# Patient Record
Sex: Male | Born: 1995 | Race: Black or African American | Hispanic: No | Marital: Single | Smoking: Current every day smoker
Health system: Southern US, Community
[De-identification: ages and names within clinical notes are randomized; demographics above are authoritative.]

---
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
## 2013-10-28 ENCOUNTER — Encounter (HOSPITAL_COMMUNITY): Payer: Self-pay | Admitting: Emergency Medicine

## 2013-10-28 ENCOUNTER — Emergency Department (HOSPITAL_COMMUNITY)
Admission: EM | Admit: 2013-10-28 | Discharge: 2013-10-28 | Disposition: A | Discharge status: Home / Self Care | Payer: Medicaid Other | Attending: Emergency Medicine | Admitting: Emergency Medicine

## 2013-10-28 DIAGNOSIS — J3489 Other specified disorders of nose and nasal sinuses: Secondary | ICD-10-CM | POA: Diagnosis not present

## 2013-10-28 DIAGNOSIS — K089 Disorder of teeth and supporting structures, unspecified: Secondary | ICD-10-CM | POA: Diagnosis present

## 2013-10-28 DIAGNOSIS — K0889 Other specified disorders of teeth and supporting structures: Secondary | ICD-10-CM

## 2013-10-28 DIAGNOSIS — K0389 Other specified diseases of hard tissues of teeth: Secondary | ICD-10-CM | POA: Diagnosis not present

## 2013-10-28 DIAGNOSIS — F17200 Nicotine dependence, unspecified, uncomplicated: Secondary | ICD-10-CM | POA: Insufficient documentation

## 2013-10-28 MED ORDER — HYDROCODONE-ACETAMINOPHEN 5-325 MG PO TABS: 1.0000 | ORAL_TABLET | Freq: Four times a day (QID) | ORAL | Status: AC | PRN

## 2013-10-28 MED ORDER — HYDROCODONE-ACETAMINOPHEN 5-325 MG PO TABS
2.0000 | ORAL_TABLET | Freq: Once | ORAL | Status: AC
  Administered 2013-10-28: 2 via ORAL
  Filled 2013-10-28: qty 2

## 2013-10-28 NOTE — Discharge Instructions (Signed)
Dental Pain °Toothache is pain in or around a tooth. It may get worse with chewing or with cold or heat.  °HOME CARE °· Your dentist may use a numbing medicine during treatment. If so, you may need to avoid eating until the medicine wears off. Ask your dentist about this. °· Only take medicine as told by your dentist or doctor. °· Avoid chewing food near the painful tooth until after all treatment is done. Ask your dentist about this. °GET HELP RIGHT AWAY IF:  °· The problem gets worse or new problems appear. °· You have a fever. °· There is redness and puffiness (swelling) of the face, jaw, or neck. °· You cannot open your mouth. °· There is pain in the jaw. °· There is very bad pain that is not helped by medicine. °MAKE SURE YOU:  °· Understand these instructions. °· Will watch your condition. °· Will get help right away if you are not doing well or get worse. °Document Released: 07/15/2007 Document Revised: 04/20/2011 Document Reviewed: 07/15/2007 °ExitCare® Patient Information ©2015 [employer]. This information is not intended to replace advice given to you by your health care provider. Make sure you discuss any questions you have with your health care provider. ° ° ° °Emergency Department Resource Guide °1) Find a Doctor and Pay Out of Pocket °Although you won't have to find out who is covered by your insurance plan, it is a good idea to ask around and get recommendations. You will then need to call the office and see if the doctor you have chosen will accept you as a new patient and what types of options they offer for patients who are self-pay. Some doctors offer discounts or will set up payment plans for their patients who do not have insurance, but you will need to ask so you aren't surprised when you get to your appointment. ° °2) Contact Your Local Health Department °Not all health departments have doctors that can see patients for sick visits, but many do, so it is worth a call to see if yours does.  If you don't know where your local health department is, you can check in your phone book. The CDC also has a tool to help you locate your state's health department, and many state websites also have listings of all of their local health departments. ° °3) Find a Walk-in Clinic °If your illness is not likely to be very severe or complicated, you may want to try a walk in clinic. These are popping up all over the country in pharmacies, drugstores, and shopping centers. They're usually staffed by nurse practitioners or physician assistants that have been trained to treat common illnesses and complaints. They're usually fairly quick and inexpensive. However, if you have serious medical issues or chronic medical problems, these are probably not your best option. ° °No Primary Care Doctor: °- Call [facility] at  832-8000 - they can help you locate a primary care doctor that  accepts your insurance, provides certain services, etc. °- Physician Referral Service- 1-800-533-3463 ° °Chronic Pain Problems: °Organization         Address  Phone   Notes  °[facility]  (336) 297-2271 Patients need to be referred by their primary care doctor.  ° °Medication Assistance: °Organization         Address  Phone   Notes  °[place] [facility] [address] °[place],  [address] (336) 641-8030 --Must be a resident   of [place] °-- Must have NO insurance coverage whatsoever (no Medicaid/ Medicare, etc.) °-- The pt. MUST have a primary care doctor that directs their care regularly and follows them in the community °  °[facility]  (866) 331-1348   °[employer]  (888) 892-1162   ° °Agencies that provide inexpensive medical care: °Organization         Address  Phone   Notes  °[facility]  (336) 832-8035   °[facility]    (336) 832-7272   °[facility] [address] °[place], [place] [address] (336) 832-4777   °[facility] [address], °[place] (336) 271-4999   °[facility]    (336) 373-0678   °[facility]    (336) 272-1050   °[facility] ° [address], [place] Phone:  (336) 832-4444, Fax:  (336) 832-4440 Hours of Operation:  9 am - 6 pm, M-F.  Also accepts Medicaid/Medicare and self-pay.  °[facility] ° [address], [place] Phone: (336) 832-3150, Fax: (336) 832-3151. Hours of Operation:  8:30 am - 5:30 pm, M-F.  Also accepts Medicaid and self-pay.  °[facility] [place] [address], [place] Phone: (336) 878-6027   °[facility] [address], [place], [place] (336)723-1848, Ext. 123 Mondays & Thursdays: 7-9 AM.  First 15 patients are seen on a first come, first serve basis. °  ° °Medicaid-accepting [place] Providers: ° °Organization         Address  Phone   Notes  °[facility] [address], [place] (336) 641-2100 Also accepts self-pay patients.  °[facility] [address], [place] ° (336) 856-9996   °[facility] [address], [place] (336) 288-8857   °[facility] [address] [place] [address], [place] (336) 299-7000   °Veita Bland [address], [place]  ° (336) 373-1557 Only accepts [place] Access Medicaid patients after they have their name applied to their card.  ° °Self-Pay (no insurance) in [place]: ° °Organization         Address  Phone   Notes  °Sickle Cell Patients, [facility] [address], [place] (336) 832-1970   °[facility] [address], [place] (336) 832-4400   °[facility] ° [address], [place] (336) 992-4800   °[facility]/Dr. Osei-Bonsu ° [address] [place] [address], [place] or [address], [place] (336) 841-8500  Phone number for both [place] and [place] locations is the same.  °[facility] [address], [place] (336) 299-0000   °[facility] [address] [place] [address], [place] or [address] (336) 852-7530 °(336) 878-2260   °[facility] [address],  (336) 350-1642, phone; (336) 294-5005, fax Sees patients 1st and 3rd Saturday of every month.  Must not qualify for public or private insurance (i.e. Medicaid, Medicare,  Health Choice, Veterans' Benefits) • Household income should be no more than 200% of the poverty level •The clinic cannot treat you if you are pregnant or think you are pregnant • Sexually transmitted diseases are not treated at the clinic.  ° ° °Dental Care: °Organization         Address    Phone  Notes  °[facility] [address], [place] (336) 641-6152 Accepts children up to age 21 who are enrolled in Medicaid or [place] Health Choice; pregnant women with a Medicaid card; and children who have applied for Medicaid or [place] Health Choice, but were declined, whose parents can pay a reduced fee at time of service.  °[facility] [place]  [address], [place] (336) 641-7733 Accepts children up to age 21 who are enrolled in Medicaid or [place] Health Choice; pregnant women with a Medicaid card; and children who have applied for Medicaid or [place] Health Choice, but were declined, whose parents can pay a reduced fee at time of service.  °[facility] ° [address], [place] (336) 641-4533 Patients are seen by appointment only. Walk-ins are not accepted. [facility] will see patients 18 years of age and older. °Monday - Tuesday (8am-5pm) °Most Wednesdays (8:30-5pm) °$30 per visit, cash only  °[facility] ° [address], [place] (336) 641-4533 Patients are seen by appointment  only. Walk-ins are not accepted. [facility] will see patients 18 years of age and older. °One Wednesday Evening (Monthly: Volunteer Based).  $30 per visit, cash only  °[facility]  (919) 537-3737 for adults; Children under age 4, call Graduate Pediatric Dentistry at (919) [phone]. Children aged 4-14, please call (919) [phone] to request a pediatric application. ° Dental services are provided in all areas of dental care including fillings, crowns and bridges, complete and partial dentures, implants, gum treatment, root canals, and extractions. Preventive care is also provided. Treatment is provided to both adults and children. °Patients are selected via a lottery and there is often a waiting list. °  °[facility] [address], °[place] ° (336) 763-8833 [email or web address] °  °[facility] [address], [place], [place] (336)723-1848, Ext. 123 Second and Fourth Thursday of each month, opens at 6:30 AM; Clinic ends at 9 AM.  Patients are seen on a first-come first-served basis, and a limited number are seen during each clinic.  ° °[facility] ° [address], [place], [place] (336) 723-7904   Eligibility Requirements °You must have lived in [place], [place], or [place] counties for at least the last three months. °  You cannot be eligible for state or federal sponsored healthcare insurance, including Veterans Administration, Medicaid, or Medicare. °  You generally cannot be eligible for healthcare insurance through your employer.  °  How to apply: °Eligibility screenings are held every Tuesday and Wednesday afternoon from 1:00 pm until 4:00 pm. You do not need an appointment for the interview!  °[facility] [address], [place], [place] 336-631-2330   °[facility]  336-342-8273   °[facility]  336-703-3100   °[facility]  336-570-6415   ° °Behavioral Health  Resources in the Community: °Intensive Outpatient Programs °Organization         Address  Phone  Notes  °[facility] [address], [place], [place] 336-878-6098   °[facility] [address], [place], [place] 336-832-9800   °[facility] [address], , [place] ° 336-882-2125   °[facility] [address],  °, [place] 1-800-853-5163 or 336-641-4981   °Substance Abuse Resources °Organization           Address  Phone  Notes  °[facility]  336-882-2125   °[facility]  336-784-9470   °[facility]  336-285-9073   °[facility]  336-845-3988   °Residential & Outpatient Substance Abuse Program  1-800-659-3381   °Psychological Services °Organization         Address  Phone  Notes  °[facility]  336- 832-9600   °[facility]  336- 378-7881   °[facility] [address], [place] 1-800-853-5163 or 336-641-4981   ° °Mobile Crisis Teams °Organization         Address  Phone  Notes  °[facility], Mobile Crisis Care Unit  1-877-626-1772   °Assertive °[facility] ° [address] [place], [place] 336-834-9664   °Sharon DeEsch [address] °[place] 336-554-5454   ° °Self-Help/Support Groups °Organization         Address  Phone             Notes  °[facility] [place] - variety of support groups  336- 373-1402 Call for more information  °Narcotics Anonymous (NA), [facility] [address], °[place]  2 meetings at this location  ° °Residential Treatment Programs °Organization         Address  Phone  Notes  °[facility] [address],    °[place]  1-866-801-8205   °[facility] ° [address], [place], [place] 704-293-8524   °[facility] [address], [place] 336-845-3988 Admissions: 8am-3pm M-F  °[facility] [address]    °[place], [place] 336-841-1104   °[facility] [address], [place], [place] 336-379-7146   °[facility] [address]  °[place], [place] 336-285-9073   °[facility] Intensive Outpatient [address], [place], [place] 336-852-3033   °[facility] [address]  °[place], [place] 1-877-615-2722 or 336-784-9470   °[facility] [address] [place], [place] 336-227-7417 Accepts Medicaid  °[facility] [address]  °[place] 1-800-659-3381 Substance Abuse/Addiction Treatment  ° °[place] Behavioral Health Resources °Organization         Address  Phone  Notes  °[facility]  (888) 581-9988   °Julie Brannon, PhD [address] [place], [place]   (336) 349-5553 or (336) 951-0000   °[facility]   [address] °[place], [place] (336) 349-4454   °[facility] [address], [place], [place] (336) 342-8316 Insurance/Medicaid/sponsorship through [facility]  °[facility] [address]                                    [place], [place] (336) 342-8316 Therapy/tele-psych/case  °[facility] [address]  ° [place], [place] (336) 349-2233    °Dr. Arfeen  (336) 349-4544   °[facility] [place]  [employer] [place] [facility] 1) [address], [place] °2) [address], [place] °3)  [address], [place] (336) 349-3220 °(336) 342-7768 ° °(336) 342-8140   °[place] [facility] (336) 342-1394 or (336) 342-3537 (After Hours)    ° °  °

## 2013-10-28 NOTE — ED Provider Notes (Signed)
CSN: [id number]     Arrival date & time 10/28/13  7829 History   First MD Initiated Contact with Patient 10/28/13 [phone]     Chief Complaint  Patient presents with  . Dental Pain     (Consider location/radiation/quality/duration/timing/severity/associated sxs/prior Treatment) HPI Comments: Patient reports worsening dental pain for the past 4 months. Pain worse with hot and cold drinks and progressed so that even cold air hurts now. Unable to sleep x 2 days due to pain. Denies fevers/ chills or trouble swallowing/breathing.   Patient is a 18 y.o. male presenting with tooth pain.  Dental Pain Location:  Upper and lower Upper teeth location:  5/RU 1st bicuspid, 4/RU 2nd bicuspid and 3/RU 1st molar Lower teeth location:  28/RL 1st bicuspid, 29/RL 2nd bicuspid, 30/RL 1st molar and 31/RL 2nd molar Quality:  Pulsating, shooting and constant Severity:  Severe Onset quality:  Gradual Duration:  4 months Timing:  Intermittent Progression:  Worsening Chronicity:  New Context: not abscess, cap still on, not dental caries, not dental fracture, normal dentition, not recent dental surgery and not trauma   Relieved by:  None tried Worsened by:  Cold food/drink, hot food/drink and touching (air) Ineffective treatments:  None tried Associated symptoms: no congestion, no difficulty swallowing, no drooling, no facial pain, no facial swelling, no fever, no gum swelling, no headaches, no oral bleeding and no oral lesions   Risk factors: lack of dental care     History reviewed. No pertinent past medical history. History reviewed. No pertinent past surgical history. No family history on file. History  Substance Use Topics  . Smoking status: Current Every Day Smoker  . Smokeless tobacco: Not on file  . Alcohol Use: No    Review of Systems  Constitutional: Negative for fever and chills.  HENT: Positive for dental problem, rhinorrhea and sinus pressure. Negative for congestion, drooling, facial  swelling, mouth sores and trouble swallowing.   Respiratory: Negative.   Cardiovascular: Negative.   Neurological: Negative for headaches.  Hematological: Negative for adenopathy.      Allergies  Review of patient's allergies indicates no known allergies.  Home Medications   Prior to Admission medications   Medication Sig Start Date End Date Taking? Authorizing Provider  HYDROcodone-acetaminophen (NORCO/VICODIN) 5-325 MG per tablet Take 1-2 tablets by mouth every 6 (six) hours as needed for moderate pain. 10/28/13   Jamal Collin, MD   BP 123/61  Pulse 79  Temp(Src) 98.1 F (36.7 C) (Oral)  Resp 20  Ht 5' 10.5" (1.791 m)  Wt 218 lb (98.884 kg)  BMI 30.83 kg/m2  SpO2 100% Physical Exam  Vitals reviewed. Constitutional: He appears well-developed and well-nourished.  HENT:  Mouth/Throat: Oropharynx is clear and moist. No oral lesions. Normal dentition. No dental abscesses, uvula swelling or dental caries.  No obvious dental caries. Gums not erythematous or swollen. No abscess. Teeth sensitive to tapping  Cardiovascular: Normal rate and regular rhythm.   Pulmonary/Chest: Effort normal and breath sounds normal.    ED Course  Procedures (including critical care time) Labs Review Labs Reviewed - No data to display  Imaging Review No results found.   EKG Interpretation None      MDM   Final diagnoses:  Pain, dental   Dental pain / teeth sensitivity x 4 months likely due to harsh brushing. No obvious dental caries of signs of infection. VSS. Patient advised to get softer tooth brush and use sensodyne tooth paste. Given Vicodin for pain # 30 until able  to get to dentist for evaluation and treatment. Return to ED precautions given for signs of infection.     Jamal Collin, MD 10/28/13 [phone]

## 2013-10-28 NOTE — ED Notes (Signed)
Pt c/o R lower jaw pain midline front to molars intermittent x 4 months. Denies fever, n/v/d or broken teeth. Pt using oragel but has not taken tylenol or motrin for pain.

## 2013-10-29 NOTE — ED Provider Notes (Signed)
I saw and evaluated the patient, reviewed the resident's note and I agree with the findings and plan.   .Face to face Exam:  General:  Awake HEENT:  Atraumatic Resp:  Normal effort Abd:  Nondistended Neuro:No focal weakness  Nelia Shi, MD 10/29/13 [phone]

## 2014-12-03 ENCOUNTER — Ambulatory Visit: Payer: Medicaid Other

## 2014-12-06 ENCOUNTER — Ambulatory Visit: Payer: Medicaid Other

## 2014-12-20 ENCOUNTER — Ambulatory Visit: Payer: Medicaid Other

## 2017-11-09 DEATH — deceased
